# Patient Record
Sex: Female | Born: 1960 | Race: Asian | Hispanic: No | Marital: Married | Smoking: Never smoker
Health system: Southern US, Community
[De-identification: ages and names within clinical notes are randomized; demographics above are authoritative.]

## PROBLEM LIST (undated history)

## (undated) DIAGNOSIS — Z98891 History of uterine scar from previous surgery: Secondary | ICD-10-CM

## (undated) DIAGNOSIS — E119 Type 2 diabetes mellitus without complications: Secondary | ICD-10-CM

---

## 2000-06-26 ENCOUNTER — Other Ambulatory Visit: Admission: RE | Admit: 2000-06-26 | Discharge: 2000-06-26 | Payer: Self-pay | Admitting: Obstetrics and Gynecology

## 2000-07-09 ENCOUNTER — Encounter: Payer: Self-pay | Admitting: Obstetrics and Gynecology

## 2000-07-09 ENCOUNTER — Ambulatory Visit (HOSPITAL_COMMUNITY): Admission: RE | Admit: 2000-07-09 | Discharge: 2000-07-09 | Payer: Self-pay | Admitting: Obstetrics and Gynecology

## 2000-07-13 ENCOUNTER — Ambulatory Visit (HOSPITAL_COMMUNITY): Admission: RE | Admit: 2000-07-13 | Discharge: 2000-07-13 | Payer: Self-pay | Admitting: Obstetrics and Gynecology

## 2000-08-10 ENCOUNTER — Encounter: Admission: RE | Admit: 2000-08-10 | Discharge: 2000-11-08 | Payer: Self-pay | Admitting: Obstetrics and Gynecology

## 2000-11-17 ENCOUNTER — Encounter (HOSPITAL_COMMUNITY): Admission: RE | Admit: 2000-11-17 | Discharge: 2000-11-27 | Payer: Self-pay | Admitting: Obstetrics and Gynecology

## 2000-11-26 ENCOUNTER — Inpatient Hospital Stay (HOSPITAL_COMMUNITY): Admission: AD | Admit: 2000-11-26 | Discharge: 2000-11-28 | Payer: Self-pay | Admitting: Obstetrics and Gynecology

## 2001-07-05 ENCOUNTER — Other Ambulatory Visit: Admission: RE | Admit: 2001-07-05 | Discharge: 2001-07-05 | Payer: Self-pay | Admitting: Obstetrics and Gynecology

## 2015-03-22 ENCOUNTER — Encounter (HOSPITAL_COMMUNITY): Payer: Self-pay | Admitting: Emergency Medicine

## 2015-03-22 ENCOUNTER — Emergency Department (HOSPITAL_COMMUNITY)
Admission: EM | Admit: 2015-03-22 | Discharge: 2015-03-22 | Disposition: A | Discharge status: Home / Self Care | Payer: Worker's Compensation | Attending: Emergency Medicine | Admitting: Emergency Medicine

## 2015-03-22 DIAGNOSIS — S6991XA Unspecified injury of right wrist, hand and finger(s), initial encounter: Secondary | ICD-10-CM | POA: Diagnosis present

## 2015-03-22 DIAGNOSIS — E119 Type 2 diabetes mellitus without complications: Secondary | ICD-10-CM | POA: Diagnosis not present

## 2015-03-22 DIAGNOSIS — W3189XA Contact with other specified machinery, initial encounter: Secondary | ICD-10-CM | POA: Diagnosis not present

## 2015-03-22 DIAGNOSIS — S61011A Laceration without foreign body of right thumb without damage to nail, initial encounter: Secondary | ICD-10-CM | POA: Diagnosis not present

## 2015-03-22 DIAGNOSIS — Y9389 Activity, other specified: Secondary | ICD-10-CM | POA: Diagnosis not present

## 2015-03-22 DIAGNOSIS — Y990 Civilian activity done for income or pay: Secondary | ICD-10-CM | POA: Diagnosis not present

## 2015-03-22 DIAGNOSIS — IMO0002 Reserved for concepts with insufficient information to code with codable children: Secondary | ICD-10-CM

## 2015-03-22 DIAGNOSIS — Y9289 Other specified places as the place of occurrence of the external cause: Secondary | ICD-10-CM | POA: Insufficient documentation

## 2015-03-22 HISTORY — DX: History of uterine scar from previous surgery: Z98.891

## 2015-03-22 HISTORY — DX: Type 2 diabetes mellitus without complications: E11.9

## 2015-03-22 MED ORDER — LIDOCAINE HCL (PF) 1 % IJ SOLN
5.0000 mL | Freq: Once | INTRAMUSCULAR | Status: AC
  Administered 2015-03-22: 5 mL via INTRADERMAL
  Filled 2015-03-22: qty 5

## 2015-03-22 NOTE — ED Provider Notes (Signed)
CSN: [id number]     Arrival date & time 03/22/15  1013 History  This chart was scribed for non-physician practitioner, Eyvonne Mechanic, PA-C working with Eber Hong, MD by Placido Sou, ED scribe. This patient was seen in room TR09C/TR09C and the patient's care was started at 10:30 AM.    Chief Complaint  Patient presents with  . Extremity Laceration   The history is provided by the patient. No language interpreter was used.    HPI Comments: Margaret Luna is a 54 y.o. female, with a history of DM, who presents to the Emergency Department complaining of a laceration with controlled bleeding to the right thumb that occurred 2 hours ago. She notes that her hand was cut on a machine at work. Pt notes a mild radiation of pain in the affected area. She denies any history of bleeding disorders or allergies to medications and notes being UTD on her TDAP vaccination. Pt notes taking metformin for her DM and further notes checking her blood sugar levels this morning with no abnormalities. She denies any other associated symptoms or other complaints. Full ROM and strength of the injured finger.    Past Medical History  Diagnosis Date  . H/O cesarean section   . Diabetes mellitus without complication    Past Surgical History  Procedure Laterality Date  . Cesarean section     No family history on file. History  Substance Use Topics  . Smoking status: Never Smoker   . Smokeless tobacco: Not on file  . Alcohol Use: No   OB History    No data available     Review of Systems  All other systems reviewed and are negative.     Allergies  Review of patient's allergies indicates no known allergies.  Home Medications   Prior to Admission medications   Not on File   BP 132/71 mmHg  Pulse 73  Temp(Src) 97.4 F (36.3 C) (Oral)  Resp 18  Ht  (1.448 m)  Wt 124 lb (56.246 kg)  BMI 26.83 kg/m2  SpO2 100% Physical Exam  Constitutional: She is oriented to person, place, and time. She  appears well-developed and well-nourished. No distress.  HENT:  Head: Normocephalic and atraumatic.  Mouth/Throat: Oropharynx is clear and moist.  Eyes: Conjunctivae and EOM are normal. Pupils are equal, round, and reactive to light.  Neck: Normal range of motion. Neck supple. No tracheal deviation present.  Cardiovascular: Normal rate.   Pulmonary/Chest: Breath sounds normal. No respiratory distress.  Abdominal: Soft.  Musculoskeletal: Normal range of motion.  Neurological: She is alert and oriented to person, place, and time.  Skin: Skin is warm and dry.     2 cm laceration to the ulnar aspect of the right 1st digit; bleeding well controlled; distal sensation capillary refill intact; restricted flexion and extension of the injured finger  Psychiatric: She has a normal mood and affect. Her behavior is normal.  Nursing note and vitals reviewed.   ED Course  Procedures  COORDINATION OF CARE: 10:33 AM Discussed treatment plan with pt at bedside and pt agreed to plan.  LACERATION REPAIR PROCEDURE NOTE The patient's identification was confirmed and consent was obtained. This procedure was performed by Eyvonne Mechanic, PA-C at 10:57 AM. Site: ulnar aspect of right 1st digit Anesthetic used (type and amt): 3 ml, 1% lidocaine Suture type/size: 4-0 proline Length: 2 cm # of Sutures: 4 Technique: simple interrupted  Complexity: basic Antibx ointment applied Tetanus UTD  Site anesthetized, irrigated with NS,  explored without evidence of foreign body, wound well approximated, site covered with dry, sterile dressing.  Patient tolerated procedure well without complications. Instructions for care discussed verbally and patient provided with additional written instructions for homecare and f/u.   Labs Review Labs Reviewed - No data to display  Imaging Review No results found.   EKG Interpretation None      MDM   Final diagnoses:  Laceration    Labs: none  indicated  Imaging: none indicated  Consults: none  Therapeutics: lidocaine, bacitracin   Plan: Laceration repaired without difficulty and without complications. Pt full ROM of finger with reduced sensation post anesthesia. Wound care instruction given, return precautions, suture removal 7 days. Pt understands and verbalizes agreement to plan and follow up.   I personally performed the services described in this documentation, which was scribed in my presence. The recorded information has been reviewed and is accurate.   Eyvonne Mechanic, PA-C 03/23/15 1353  Eber Hong, MD 03/24/15 [phone]

## 2015-03-22 NOTE — Discharge Instructions (Signed)
Please read attached information, please follow-up with health care provider in 7 days for suture removal. Please follow-up immediately if signs of infection present.

## 2015-03-22 NOTE — ED Notes (Signed)
Pt has laceration to right thumb from machinery at work. Bleeding controlled. 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

## 2018-12-23 ENCOUNTER — Other Ambulatory Visit: Payer: Self-pay | Admitting: Internal Medicine

## 2018-12-23 ENCOUNTER — Other Ambulatory Visit: Payer: Self-pay | Admitting: General Surgery

## 2018-12-23 DIAGNOSIS — Z1231 Encounter for screening mammogram for malignant neoplasm of breast: Secondary | ICD-10-CM

## 2019-03-18 ENCOUNTER — Ambulatory Visit: Payer: Self-pay

## 2019-04-27 ENCOUNTER — Ambulatory Visit
Admission: RE | Admit: 2019-04-27 | Discharge: 2019-04-27 | Disposition: A | Discharge status: Home / Self Care | Payer: BC Managed Care – PPO | Source: Ambulatory Visit | Attending: Internal Medicine | Admitting: Internal Medicine

## 2019-04-27 ENCOUNTER — Other Ambulatory Visit: Payer: Self-pay

## 2019-04-27 DIAGNOSIS — Z1231 Encounter for screening mammogram for malignant neoplasm of breast: Secondary | ICD-10-CM

## 2020-01-11 ENCOUNTER — Other Ambulatory Visit: Payer: Self-pay | Admitting: Internal Medicine

## 2020-01-11 DIAGNOSIS — Z1231 Encounter for screening mammogram for malignant neoplasm of breast: Secondary | ICD-10-CM

## 2020-04-27 ENCOUNTER — Other Ambulatory Visit: Payer: Self-pay

## 2020-04-27 ENCOUNTER — Ambulatory Visit
Admission: RE | Admit: 2020-04-27 | Discharge: 2020-04-27 | Disposition: A | Discharge status: Home / Self Care | Payer: BC Managed Care – PPO | Source: Ambulatory Visit | Attending: Internal Medicine | Admitting: Internal Medicine

## 2020-04-27 DIAGNOSIS — Z1231 Encounter for screening mammogram for malignant neoplasm of breast: Secondary | ICD-10-CM

## 2020-11-14 ENCOUNTER — Other Ambulatory Visit: Payer: Self-pay | Admitting: Internal Medicine

## 2020-11-14 DIAGNOSIS — Z1231 Encounter for screening mammogram for malignant neoplasm of breast: Secondary | ICD-10-CM

## 2021-04-29 ENCOUNTER — Other Ambulatory Visit: Payer: Self-pay

## 2021-04-29 ENCOUNTER — Ambulatory Visit
Admission: RE | Admit: 2021-04-29 | Discharge: 2021-04-29 | Disposition: A | Discharge status: Home / Self Care | Payer: BC Managed Care – PPO | Source: Ambulatory Visit | Attending: Internal Medicine | Admitting: Internal Medicine

## 2021-04-29 DIAGNOSIS — Z1231 Encounter for screening mammogram for malignant neoplasm of breast: Secondary | ICD-10-CM

## 2021-05-02 ENCOUNTER — Other Ambulatory Visit: Payer: Self-pay | Admitting: Internal Medicine

## 2021-05-02 DIAGNOSIS — R928 Other abnormal and inconclusive findings on diagnostic imaging of breast: Secondary | ICD-10-CM

## 2023-07-08 IMAGING — MG MM DIGITAL SCREENING BILAT W/ TOMO AND CAD
8 series · 9 of 24 positions shown · non-contrast
Comparison: Previous exam(s).

CLINICAL DATA: Screening.

EXAM:
DIGITAL SCREENING BILATERAL MAMMOGRAM WITH TOMOSYNTHESIS AND CAD
TECHNIQUE: Bilateral screening digital craniocaudal and mediolateral oblique
mammograms were obtained. Bilateral screening digital breast
tomosynthesis was performed. The images were evaluated with
computer-aided detection.

[R MLO synth-2D]
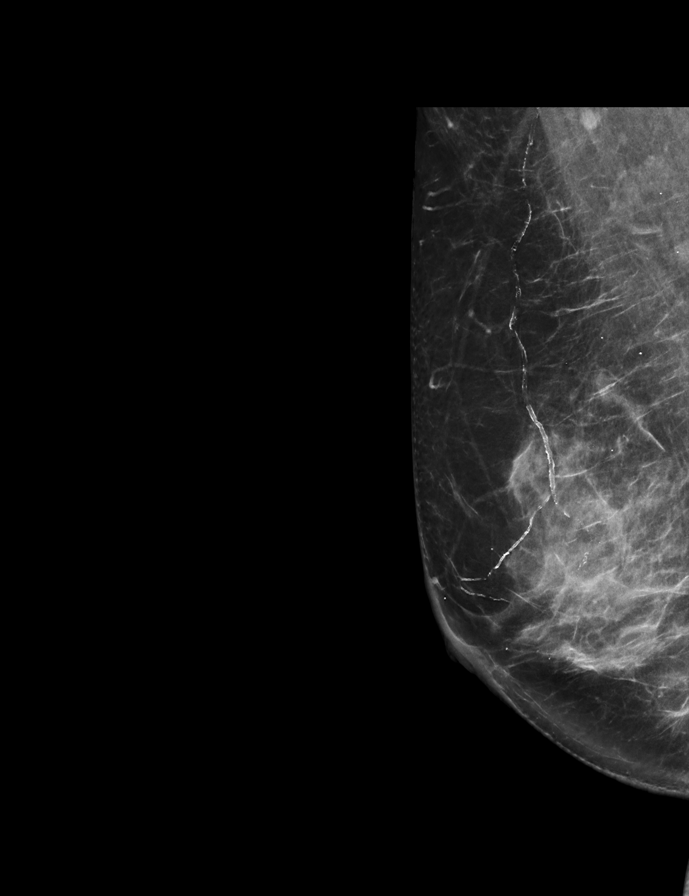

[L MLO synth-2D]
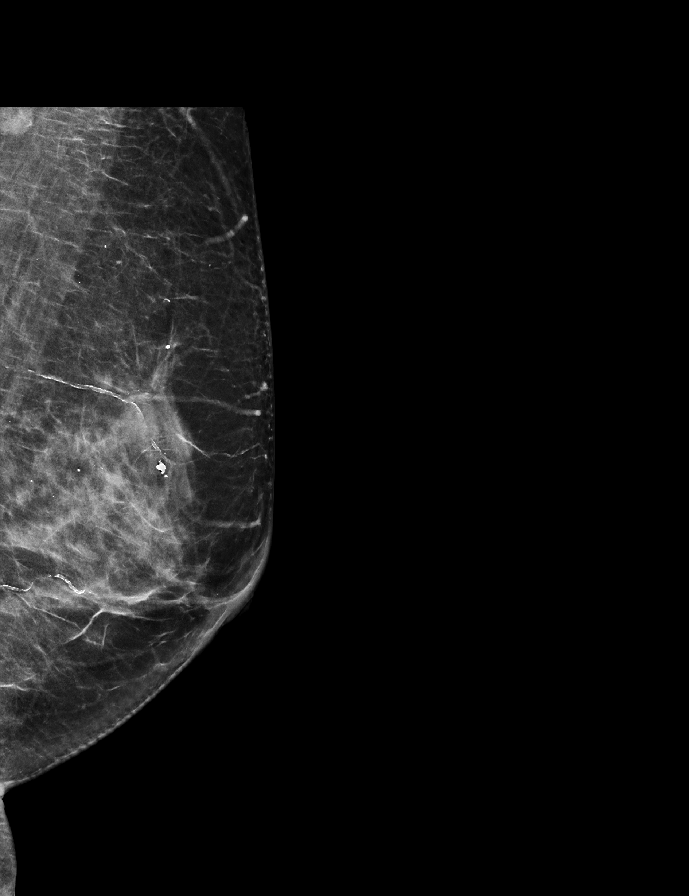

[L CC synth-2D]
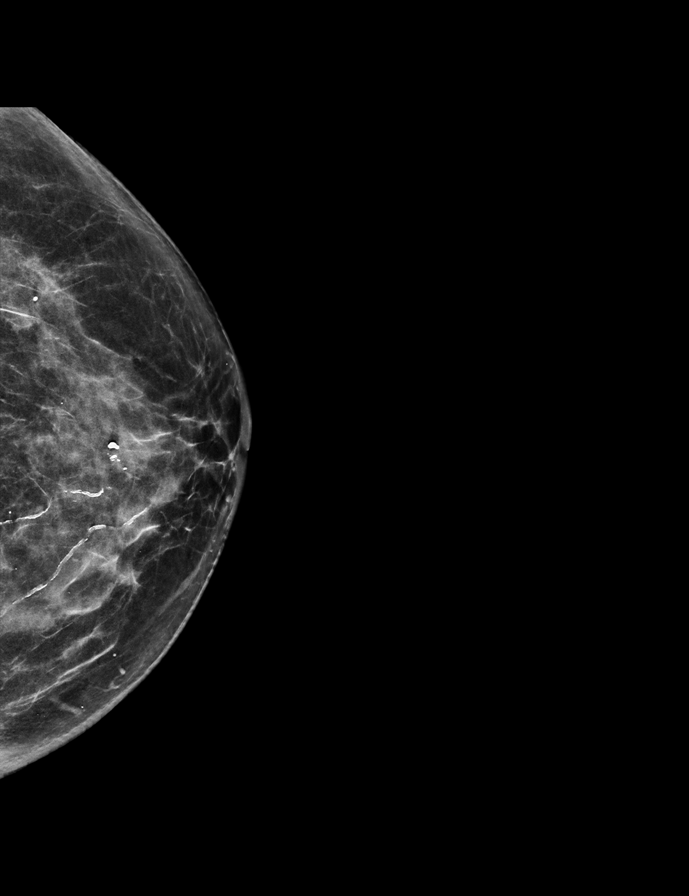

[R CC synth-2D]
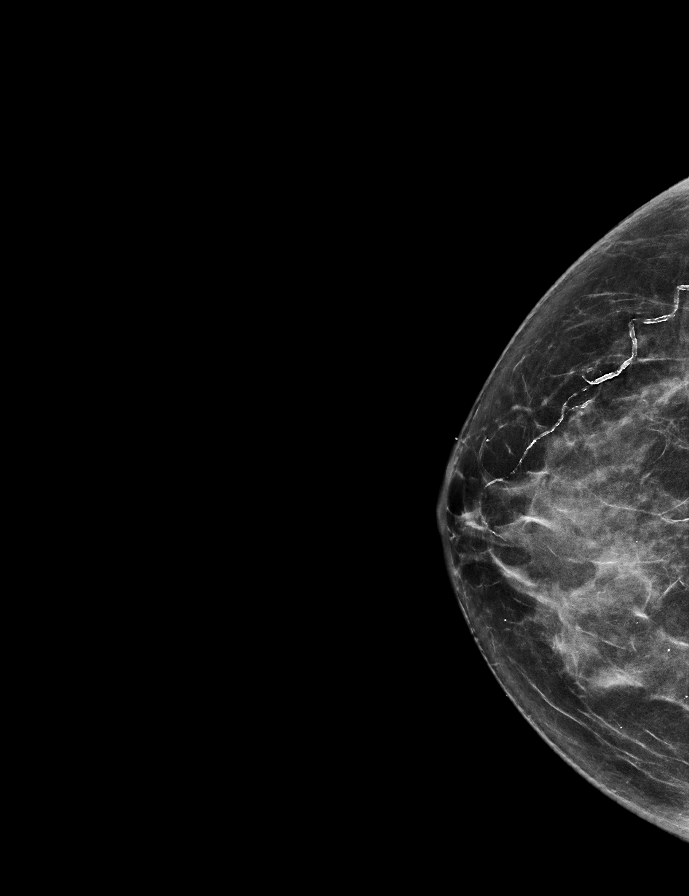

[R CC tomo · 2 of 64 frames shown]
[frame 21/64]
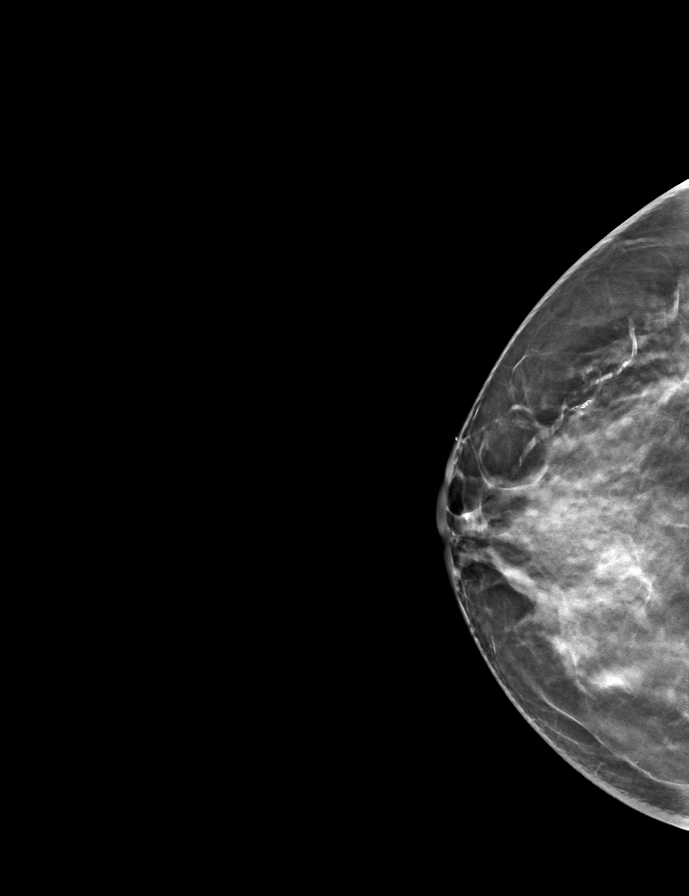
[frame 33/64]
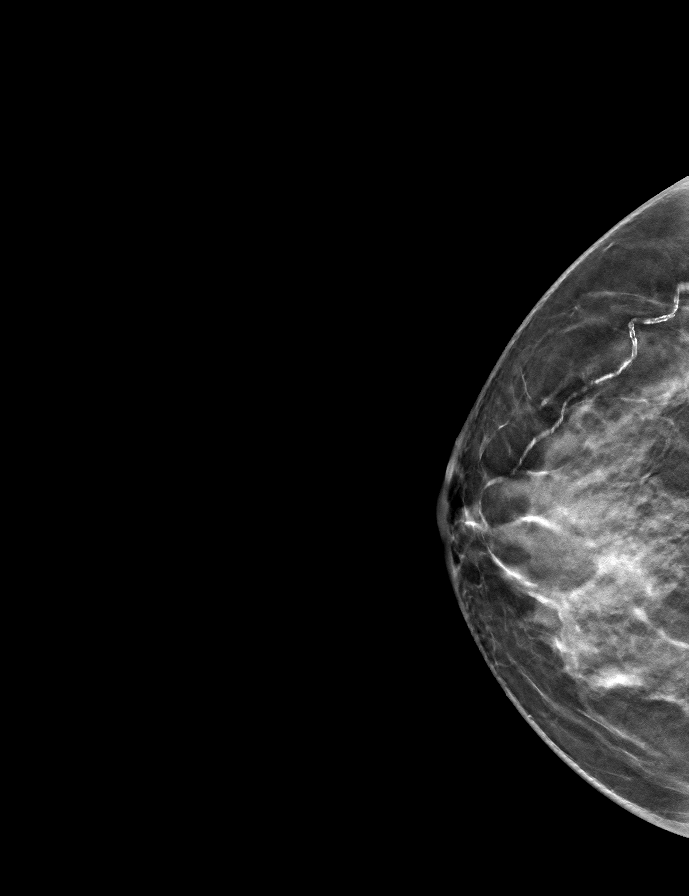

[R MLO tomo · tomo slice 35/70.0]
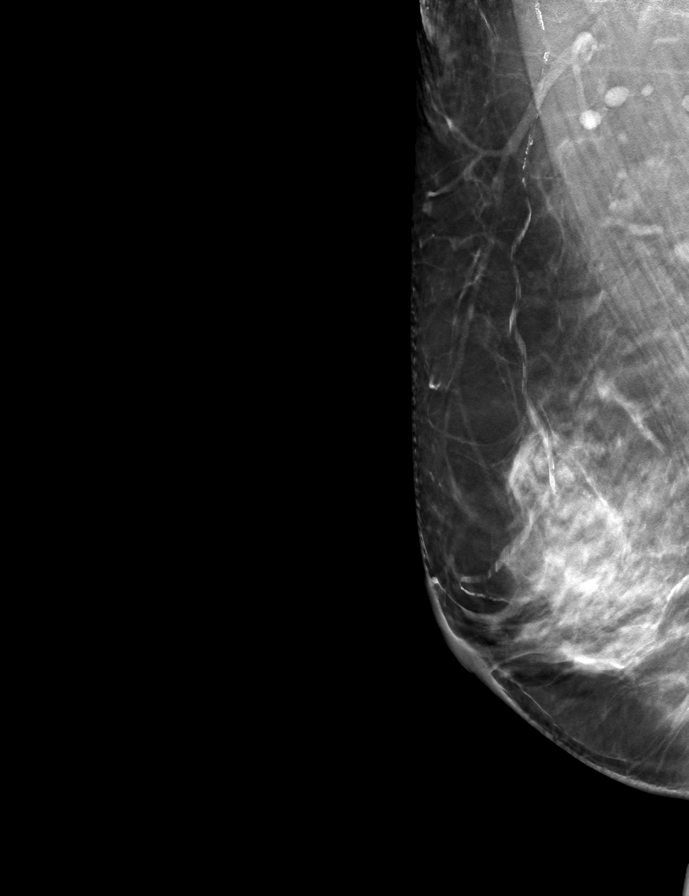

[L CC tomo · tomo slice 33/65.0]
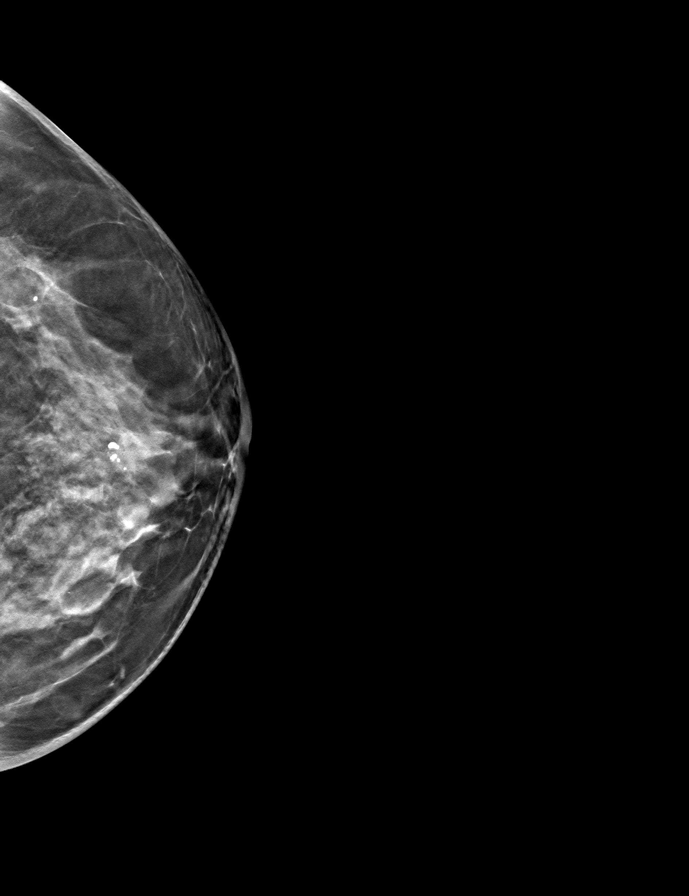

[L MLO tomo · tomo slice 33/65.0]
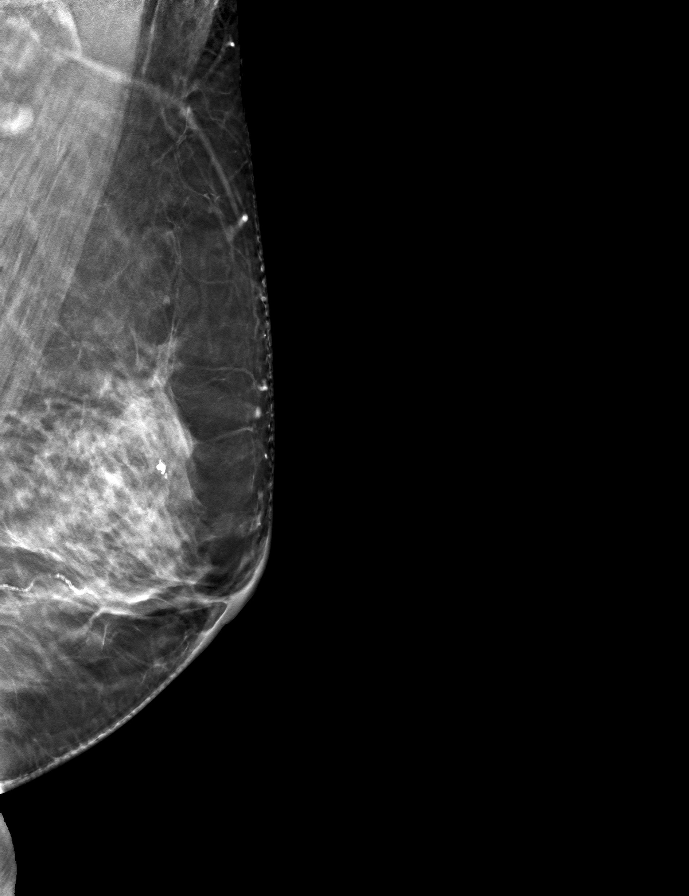

[9 of 24 positions shown; findings below may reference images not displayed]

ACR Breast Density Category c: The breast tissue is heterogeneously
dense, which may obscure small masses.
FINDINGS: In the left breast, possible distortion warrants further evaluation.
In the right breast, no findings suspicious for malignancy.
IMPRESSION: Further evaluation is suggested for possible distortion in the left
breast.

RECOMMENDATION:
Diagnostic mammogram and possibly ultrasound of the left breast.
(Code:19-D-HH3)

The patient will be contacted regarding the findings, and additional
imaging will be scheduled.

BI-RADS CATEGORY  0: Incomplete. Need additional imaging evaluation
and/or prior mammograms for comparison.
